# Patient Record
Sex: Female | Born: 1985 | Race: Black or African American | Hispanic: No | Marital: Single | Smoking: Current every day smoker
Health system: Southern US, Community
[De-identification: ages and names within clinical notes are randomized; demographics above are authoritative.]

## PROBLEM LIST (undated history)

## (undated) ENCOUNTER — Inpatient Hospital Stay (HOSPITAL_COMMUNITY): Payer: Self-pay

## (undated) HISTORY — PX: NO PAST SURGERIES: SHX2092

---

## 2014-11-18 ENCOUNTER — Encounter (HOSPITAL_COMMUNITY): Payer: Self-pay | Admitting: *Deleted

## 2014-11-18 ENCOUNTER — Inpatient Hospital Stay (HOSPITAL_COMMUNITY): Payer: Medicaid Other

## 2014-11-18 ENCOUNTER — Inpatient Hospital Stay (HOSPITAL_COMMUNITY)
Admission: AD | Admit: 2014-11-18 | Discharge: 2014-11-18 | Disposition: A | Discharge status: Home / Self Care | Payer: Medicaid Other | Source: Ambulatory Visit | Attending: Family Medicine | Admitting: Family Medicine

## 2014-11-18 DIAGNOSIS — Z3A01 Less than 8 weeks gestation of pregnancy: Secondary | ICD-10-CM | POA: Insufficient documentation

## 2014-11-18 DIAGNOSIS — A599 Trichomoniasis, unspecified: Secondary | ICD-10-CM

## 2014-11-18 DIAGNOSIS — O26851 Spotting complicating pregnancy, first trimester: Secondary | ICD-10-CM

## 2014-11-18 DIAGNOSIS — R109 Unspecified abdominal pain: Secondary | ICD-10-CM | POA: Diagnosis present

## 2014-11-18 DIAGNOSIS — O98311 Other infections with a predominantly sexual mode of transmission complicating pregnancy, first trimester: Secondary | ICD-10-CM | POA: Insufficient documentation

## 2014-11-18 DIAGNOSIS — F17210 Nicotine dependence, cigarettes, uncomplicated: Secondary | ICD-10-CM | POA: Insufficient documentation

## 2014-11-18 DIAGNOSIS — O3680X0 Pregnancy with inconclusive fetal viability, not applicable or unspecified: Secondary | ICD-10-CM

## 2014-11-18 DIAGNOSIS — O26899 Other specified pregnancy related conditions, unspecified trimester: Secondary | ICD-10-CM

## 2014-11-18 DIAGNOSIS — O99331 Smoking (tobacco) complicating pregnancy, first trimester: Secondary | ICD-10-CM | POA: Insufficient documentation

## 2014-11-18 DIAGNOSIS — A5901 Trichomonal vulvovaginitis: Secondary | ICD-10-CM | POA: Insufficient documentation

## 2014-11-18 LAB — URINALYSIS, ROUTINE W REFLEX MICROSCOPIC
BILIRUBIN URINE: NEGATIVE
GLUCOSE, UA: NEGATIVE mg/dL
HGB URINE DIPSTICK: NEGATIVE
KETONES UR: NEGATIVE mg/dL
NITRITE: NEGATIVE
PH: 8.5 — AB (ref 5.0–8.0)
Protein, ur: NEGATIVE mg/dL
SPECIFIC GRAVITY, URINE: 1.015 (ref 1.005–1.030)
Urobilinogen, UA: 2 mg/dL — ABNORMAL HIGH (ref 0.0–1.0)

## 2014-11-18 LAB — CBC WITH DIFFERENTIAL/PLATELET
Basophils Absolute: 0 10*3/uL (ref 0.0–0.1)
Basophils Relative: 1 %
EOS ABS: 0.2 10*3/uL (ref 0.0–0.7)
EOS PCT: 3 %
HCT: 37.2 % (ref 36.0–46.0)
HEMOGLOBIN: 12.9 g/dL (ref 12.0–15.0)
LYMPHS ABS: 2.7 10*3/uL (ref 0.7–4.0)
LYMPHS PCT: 33 %
MCH: 32.3 pg (ref 26.0–34.0)
MCHC: 34.7 g/dL (ref 30.0–36.0)
MCV: 93 fL (ref 78.0–100.0)
MONOS PCT: 5 %
Monocytes Absolute: 0.4 10*3/uL (ref 0.1–1.0)
Neutro Abs: 4.8 10*3/uL (ref 1.7–7.7)
Neutrophils Relative %: 58 %
PLATELETS: 196 10*3/uL (ref 150–400)
RBC: 4 MIL/uL (ref 3.87–5.11)
RDW: 13.4 % (ref 11.5–15.5)
WBC: 8.1 10*3/uL (ref 4.0–10.5)

## 2014-11-18 LAB — URINE MICROSCOPIC-ADD ON

## 2014-11-18 LAB — WET PREP, GENITAL: Yeast Wet Prep HPF POC: NONE SEEN

## 2014-11-18 LAB — ABO/RH: ABO/RH(D): B POS

## 2014-11-18 LAB — HCG, QUANTITATIVE, PREGNANCY: hCG, Beta Chain, Quant, S: 2073 m[IU]/mL — ABNORMAL HIGH (ref <5)

## 2014-11-18 LAB — POCT PREGNANCY, URINE: Preg Test, Ur: POSITIVE — AB

## 2014-11-18 MED ORDER — METRONIDAZOLE 500 MG PO TABS
2000.0000 mg | ORAL_TABLET | Freq: Once | ORAL | Status: AC
  Administered 2014-11-18: 2000 mg via ORAL
  Filled 2014-11-18: qty 4

## 2014-11-18 NOTE — MAU Note (Signed)
Pt presents complaining of lower abdominal pain x2 days. Had spotting but it has resolved. +HPT. LMP 10/10/2014. Denies abnormal discharge.

## 2014-11-18 NOTE — Discharge Instructions (Signed)
First Trimester of Pregnancy The first trimester of pregnancy is from week 1 until the end of week 12 (months 1 through 3). During this time, your baby will begin to develop inside you. At 6-8 weeks, the eyes and face are formed, and the heartbeat can be seen on ultrasound. At the end of 12 weeks, all the baby's organs are formed. Prenatal care is all the medical care you receive before the birth of your baby. Make sure you get good prenatal care and follow all of your doctor's instructions. HOME CARE  Medicines  Take medicine only as told by your doctor. Some medicines are safe and some are not during pregnancy.  Take your prenatal vitamins as told by your doctor.  Take medicine that helps you poop (stool softener) as needed if your doctor says it is okay. Diet  Eat regular, healthy meals.  Your doctor will tell you the amount of weight gain that is right for you.  Avoid raw meat and uncooked cheese.  If you feel sick to your stomach (nauseous) or throw up (vomit):  Eat 4 or 5 small meals a day instead of 3 large meals.  Try eating a few soda crackers.  Drink liquids between meals instead of during meals.  If you have a hard time pooping (constipation):  Eat high-fiber foods like fresh vegetables, fruit, and whole grains.  Drink enough fluids to keep your pee (urine) clear or pale yellow. Activity and Exercise  Exercise only as told by your doctor. Stop exercising if you have cramps or pain in your lower belly (abdomen) or low back.  Try to avoid standing for long periods of time. Move your legs often if you must stand in one place for a long time.  Avoid heavy lifting.  Wear low-heeled shoes. Sit and stand up straight.  You can have sex unless your doctor tells you not to. Relief of Pain or Discomfort  Wear a good support bra if your breasts are sore.  Take warm water baths (sitz baths) to soothe pain or discomfort caused by hemorrhoids. Use hemorrhoid cream if your  doctor says it is okay.  Rest with your legs raised if you have leg cramps or low back pain.  Wear support hose if you have puffy, bulging veins (varicose veins) in your legs. Raise (elevate) your feet for 15 minutes, 3-4 times a day. Limit salt in your diet. Prenatal Care  Schedule your prenatal visits by the twelfth week of pregnancy.  Write down your questions. Take them to your prenatal visits.  Keep all your prenatal visits as told by your doctor. Safety  Wear your seat belt at all times when driving.  Make a list of emergency phone numbers. The list should include numbers for family, friends, the hospital, and police and fire departments. General Tips  Ask your doctor for a referral to a local prenatal class. Begin classes no later than at the start of month 6 of your pregnancy.  Ask for help if you need counseling or help with nutrition. Your doctor can give you advice or tell you where to go for help.  Do not use hot tubs, steam rooms, or saunas.  Do not douche or use tampons or scented sanitary pads.  Do not cross your legs for long periods of time.  Avoid litter boxes and soil used by cats.  Avoid all smoking, herbs, and alcohol. Avoid drugs not approved by your doctor.  Visit your dentist. At home, brush your teeth  with a soft toothbrush. Be gentle when you floss. GET HELP IF:  You are dizzy.  You have mild cramps or pressure in your lower belly.  You have a nagging pain in your belly area.  You continue to feel sick to your stomach, throw up, or have watery poop (diarrhea).  You have a bad smelling fluid coming from your vagina.  You have pain with peeing (urination).  You have increased puffiness (swelling) in your face, hands, legs, or ankles. GET HELP RIGHT AWAY IF:   You have a fever.  You are leaking fluid from your vagina.  You have spotting or bleeding from your vagina.  You have very bad belly cramping or pain.  You gain or lose weight  rapidly.  You throw up blood. It may look like coffee grounds.  You are around people who have [place] measles, fifth disease, or chickenpox.  You have a very bad headache.  You have shortness of breath.  You have any kind of trauma, such as from a fall or a car accident. Document Released: 08/04/2007 Document Revised: 07/02/2013 Document Reviewed: 12/26/2012 [facility] Patient Information 2015 [place], [place]. This information is not intended to replace advice given to you by your health care provider. Make sure you discuss any questions you have with your health care provider. Trichomoniasis Trichomoniasis is an infection caused by an organism called Trichomonas. The infection can affect both women and men. In women, the outer female genitalia and the vagina are affected. In men, the penis is mainly affected, but the prostate and other reproductive organs can also be involved. Trichomoniasis is a sexually transmitted infection (STI) and is most often passed to another person through sexual contact.  RISK FACTORS  Having unprotected sexual intercourse.  Having sexual intercourse with an infected partner. SIGNS AND SYMPTOMS  Symptoms of trichomoniasis in women include:  Abnormal gray-green frothy vaginal discharge.  Itching and irritation of the vagina.  Itching and irritation of the area outside the vagina. Symptoms of trichomoniasis in men include:   Penile discharge with or without pain.  Pain during urination. This results from inflammation of the urethra. DIAGNOSIS  Trichomoniasis may be found during a Pap test or physical exam. Your health care provider may use one of the following methods to help diagnose this infection:  Examining vaginal discharge under a microscope. For men, urethral discharge would be examined.  Testing the pH of the vagina with a test tape.  Using a vaginal swab test that checks for the Trichomonas organism. A test is available that provides results  within a few minutes.  Doing a culture test for the organism. This is not usually needed. TREATMENT   You may be given medicine to fight the infection. Women should inform their health care provider if they could be or are pregnant. Some medicines used to treat the infection should not be taken during pregnancy.  Your health care provider may recommend over-the-counter medicines or creams to decrease itching or irritation.  Your sexual partner will need to be treated if infected. HOME CARE INSTRUCTIONS   Take medicines only as directed by your health care provider.  Take over-the-counter medicine for itching or irritation as directed by your health care provider.  Do not have sexual intercourse while you have the infection.  Women should not douche or wear tampons while they have the infection.  Discuss your infection with your partner. Your partner may have gotten the infection from you, or you may have gotten it from  your partner.  Have your sex partner get examined and treated if necessary.  Practice safe, informed, and protected sex.  See your health care provider for other STI testing. SEEK MEDICAL CARE IF:   You still have symptoms after you finish your medicine.  You develop abdominal pain.  You have pain when you urinate.  You have bleeding after sexual intercourse.  You develop a rash.  Your medicine makes you sick or makes you throw up (vomit). MAKE SURE YOU:  Understand these instructions.  Will watch your condition.  Will get help right away if you are not doing well or get worse. Document Released: 08/11/2000 Document Revised: 07/02/2013 Document Reviewed: 11/27/2012 [facility] Patient Information 2015 [place], [place]. This information is not intended to replace advice given to you by your health care provider. Make sure you discuss any questions you have with your health care provider.

## 2014-11-18 NOTE — MAU Provider Note (Signed)
History     CSN: [id number]  Arrival date and time: 11/18/14 4132   First Ira Busbin Initiated Contact with Patient 11/18/14 1941      Chief Complaint  Patient presents with  . Abdominal Pain   HPI [name] is a 29 y.o. [id number] at [redacted]w[redacted]d who presents to MAU today with complaint of abdominal pain in the lower abdomen x 3 days. She rates pain at 7/10 now. She has not taken anything for pain. She states that pain is constant, but worse with ambulation. She states spotting noted 3 days ago, but none today. She denies discharge, fever, N/V/D or constipation or UTI symptoms. She states last intercourse was yesterday.   OB History    Gravida Para Term Preterm AB TAB SAB Ectopic Multiple Living   History reviewed. No pertinent past medical history.  Past Surgical History  Procedure Laterality Date  . No past surgeries      History reviewed. No pertinent family history.  Social History  Substance Use Topics  . Smoking status: Current Every Day Smoker -- 0.50 packs/day for 1 years    Types: Cigarettes  . Smokeless tobacco: None  . Alcohol Use: No    Allergies: No Known Allergies  Prescriptions prior to admission  Medication Sig Dispense Refill Last Dose  . Prenatal Vit-Fe Fumarate-FA (PRENATAL MULTIVITAMIN) TABS tablet Take 1 tablet by mouth daily at 12 noon.   11/18/2014 at 0630    Review of Systems  Constitutional: Negative for fever and malaise/fatigue.  Gastrointestinal: Positive for abdominal pain. Negative for nausea, vomiting, diarrhea and constipation.  Genitourinary: Negative for dysuria, urgency and frequency.       Neg - vaginal bleeding, discharge   Physical Exam   Blood pressure 115/71, pulse 85, temperature 98 F (36.7 C), temperature source Oral, resp. rate 18, height  (1.651 m), weight 199 lb 6.4 oz (90.447 kg), last menstrual period 10/10/2014.  Physical Exam  Nursing note and vitals reviewed. Constitutional: She is  oriented to person, place, and time. She appears well-developed and well-nourished. No distress.  HENT:  Head: Normocephalic and atraumatic.  Cardiovascular: Normal rate.   Respiratory: Effort normal.  GI: Soft. She exhibits no distension and no mass. There is tenderness (mild tenderness to palpation of the LLQ ). There is no rebound and no guarding.  Genitourinary: Uterus is not enlarged and not tender. Cervix exhibits no motion tenderness, no discharge and no friability. Right adnexum displays no mass and no tenderness. Left adnexum displays no mass and no tenderness. No bleeding in the vagina. Vaginal discharge (small amount of thin, white discharge noted) found.  Cervix: closed, thick  Neurological: She is alert and oriented to person, place, and time.  Skin: Skin is warm and dry. No erythema.  Psychiatric: She has a normal mood and affect.    Results for orders placed or performed during the hospital encounter of 11/18/14 (from the past 24 hour(s))  Urinalysis, Routine w reflex microscopic (not at [facility])     Status: Abnormal   Collection Time: 11/18/14  7:05 PM  Result Value Ref Range   Color, Urine YELLOW YELLOW   APPearance HAZY (A) CLEAR   Specific Gravity, Urine 1.015 1.005 - 1.030   pH 8.5 (H) 5.0 - 8.0   Glucose, UA NEGATIVE NEGATIVE mg/dL   Hgb urine dipstick NEGATIVE NEGATIVE   Bilirubin Urine NEGATIVE NEGATIVE   Ketones, ur NEGATIVE  NEGATIVE mg/dL   Protein, ur NEGATIVE NEGATIVE mg/dL   Urobilinogen, UA 2.0 (H) 0.0 - 1.0 mg/dL   Nitrite NEGATIVE NEGATIVE   Leukocytes, UA MODERATE (A) NEGATIVE  Urine microscopic-add on     Status: Abnormal   Collection Time: 11/18/14  7:05 PM  Result Value Ref Range   Squamous Epithelial / LPF MANY (A) RARE   WBC, UA 11-20 <3 WBC/hpf   RBC / HPF 3-6 <3 RBC/hpf   Bacteria, UA MANY (A) RARE   Urine-Other MUCOUS PRESENT   Pregnancy, urine POC     Status: Abnormal   Collection Time: 11/18/14  7:33 PM  Result Value Ref Range   Preg  Test, Ur POSITIVE (A) NEGATIVE  CBC with Differential/Platelet     Status: None   Collection Time: 11/18/14  7:50 PM  Result Value Ref Range   WBC 8.1 4.0 - 10.5 K/uL   RBC 4.00 3.87 - 5.11 MIL/uL   Hemoglobin 12.9 12.0 - 15.0 g/dL   HCT 16.1 09.6 - 04.5 %   MCV 93.0 78.0 - 100.0 fL   MCH 32.3 26.0 - 34.0 pg   MCHC 34.7 30.0 - 36.0 g/dL   RDW 40.9 81.1 - 91.4 %   Platelets 196 150 - 400 K/uL   Neutrophils Relative % 58 %   Neutro Abs 4.8 1.7 - 7.7 K/uL   Lymphocytes Relative 33 %   Lymphs Abs 2.7 0.7 - 4.0 K/uL   Monocytes Relative 5 %   Monocytes Absolute 0.4 0.1 - 1.0 K/uL   Eosinophils Relative 3 %   Eosinophils Absolute 0.2 0.0 - 0.7 K/uL   Basophils Relative 1 %   Basophils Absolute 0.0 0.0 - 0.1 K/uL  ABO/Rh     Status: None (Preliminary result)   Collection Time: 11/18/14  7:50 PM  Result Value Ref Range   ABO/RH(D) B POS   hCG, quantitative, pregnancy     Status: Abnormal   Collection Time: 11/18/14  7:50 PM  Result Value Ref Range   hCG, Beta Chain, Quant, S 2073 (H) <5 mIU/mL  Wet prep, genital     Status: Abnormal   Collection Time: 11/18/14  7:55 PM  Result Value Ref Range   Yeast Wet Prep HPF POC NONE SEEN NONE SEEN   Trich, Wet Prep FEW (A) NONE SEEN   Clue Cells Wet Prep HPF POC FEW (A) NONE SEEN   WBC, Wet Prep HPF POC FEW (A) NONE SEEN   US Ob Comp Less 14 Wks  11/18/2014   CLINICAL DATA:  Abdominal pain affecting pregnancy, spotting in first trimester  EXAM: OBSTETRIC <14 WK [place] AND TRANSVAGINAL OB US  TECHNIQUE: Both transabdominal and transvaginal ultrasound examinations were performed for complete evaluation of the gestation as well as the maternal uterus, adnexal regions, and pelvic cul-de-sac. Transvaginal technique was performed to assess early pregnancy.  COMPARISON:  None.  FINDINGS: Intrauterine gestational sac: Present  Yolk sac:  Not identified  Embryo:  Not identified  Cardiac Activity: N/A  Heart Rate: N/A  bpm  MSD: 3.9  mm   5 w   1  d  Maternal  uterus/adnexae:  Probable tiny subchorionic hemorrhage.  RIGHT ovary normal size and morphology, 3.9 x 2.2 x 4.5 cm.  LEFT ovary normal size and morphology, 4.1 x 1.3 x 4.3 cm.  Small LEFT adnexal cyst of simple character, 1.8 x 1.0 x 1.4 cm.  No additional adnexal masses.  Small amount of nonspecific free pelvic fluid.  IMPRESSION:  Tiny probable gestational sac within the uterus as above with probable tiny amount of subchorionic hemorrhage.  Small amount of nonspecific free pelvic fluid.  Small LEFT paraovarian cyst.   Electronically Signed   By: Ulyses Southward M.D.   On: 11/18/2014 21:21   US Ob Transvaginal  11/18/2014   CLINICAL DATA:  Abdominal pain affecting pregnancy, spotting in first trimester  EXAM: OBSTETRIC <14 WK [place] AND TRANSVAGINAL OB US  TECHNIQUE: Both transabdominal and transvaginal ultrasound examinations were performed for complete evaluation of the gestation as well as the maternal uterus, adnexal regions, and pelvic cul-de-sac. Transvaginal technique was performed to assess early pregnancy.  COMPARISON:  None.  FINDINGS: Intrauterine gestational sac: Present  Yolk sac:  Not identified  Embryo:  Not identified  Cardiac Activity: N/A  Heart Rate: N/A  bpm  MSD: 3.9  mm   5 w   1  d  Maternal uterus/adnexae:  Probable tiny subchorionic hemorrhage.  RIGHT ovary normal size and morphology, 3.9 x 2.2 x 4.5 cm.  LEFT ovary normal size and morphology, 4.1 x 1.3 x 4.3 cm.  Small LEFT adnexal cyst of simple character, 1.8 x 1.0 x 1.4 cm.  No additional adnexal masses.  Small amount of nonspecific free pelvic fluid.  IMPRESSION: Tiny probable gestational sac within the uterus as above with probable tiny amount of subchorionic hemorrhage.  Small amount of nonspecific free pelvic fluid.  Small LEFT paraovarian cyst.   Electronically Signed   By: Ulyses Southward M.D.   On: 11/18/2014 21:21    MAU Course  Procedures None  MDM +UPT UA, wet prep, GC/chlamydia, CBC, ABO/Rh, quant hCG, HIV, RPR and [place] today  to rule out ectopic pregnancy Urine culture pending  + Trichomonas on wet prep. Patient treated with 2 G Flagyl in MAU.  Assessment and Plan  A: Pregnancy of unknown location Abdominal pain in pregnancy Trichomonas infection  P: Discharge home Bleeding/ectopic precautions discussed Patient treated in MAU for trichomonas. Partner treatment and abstinence advised Patient advised to follow-up in MAU in 48 hours for repeat labs or sooner if her condition were to change or worsen   Marny Lowenstein, PA-C  11/18/2014, 9:24 PM

## 2014-11-19 ENCOUNTER — Telehealth (HOSPITAL_COMMUNITY): Payer: Self-pay | Admitting: *Deleted

## 2014-11-19 DIAGNOSIS — A749 Chlamydial infection, unspecified: Secondary | ICD-10-CM

## 2014-11-19 DIAGNOSIS — O98811 Other maternal infectious and parasitic diseases complicating pregnancy, first trimester: Principal | ICD-10-CM

## 2014-11-19 LAB — GC/CHLAMYDIA PROBE AMP (~~LOC~~) NOT AT ARMC
Chlamydia: POSITIVE — AB
Neisseria Gonorrhea: NEGATIVE

## 2014-11-19 LAB — HIV ANTIBODY (ROUTINE TESTING W REFLEX): HIV Screen 4th Generation wRfx: NONREACTIVE

## 2014-11-19 LAB — RPR: RPR: NONREACTIVE

## 2014-11-19 MED ORDER — AZITHROMYCIN 500 MG PO TABS: ORAL_TABLET | ORAL | Status: AC

## 2014-11-19 NOTE — Telephone Encounter (Signed)
Telephone call to patient regarding positive chlamydia culture, patient notified.  Patient had not been treated, Rx routed to pharmacy per protocol.  Instructed patient to notify her partner for treatment and to abstain from sex for seven days post treatment.  Report faxed to [facility].

## 2014-11-21 LAB — CULTURE, OB URINE

## 2015-09-23 ENCOUNTER — Encounter (HOSPITAL_COMMUNITY): Payer: Self-pay

## 2017-04-15 IMAGING — US US OB TRANSVAGINAL
1 series · 15 of 28 positions shown · non-contrast
Comparison: None.

CLINICAL DATA: Abdominal pain affecting pregnancy, spotting in
first trimester

EXAM:
OBSTETRIC <14 WK US AND TRANSVAGINAL OB US
TECHNIQUE: Both transabdominal and transvaginal ultrasound examinations were
performed for complete evaluation of the gestation as well as the
maternal uterus, adnexal regions, and pelvic cul-de-sac.
Transvaginal technique was performed to assess early pregnancy.

[Series 1: us ob transvaginal · 15 of 85 slices shown]
[im 1/85]
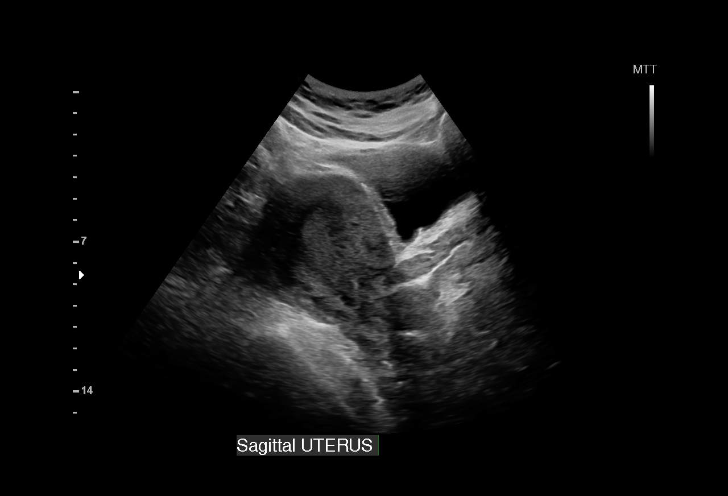
[im 7/85]
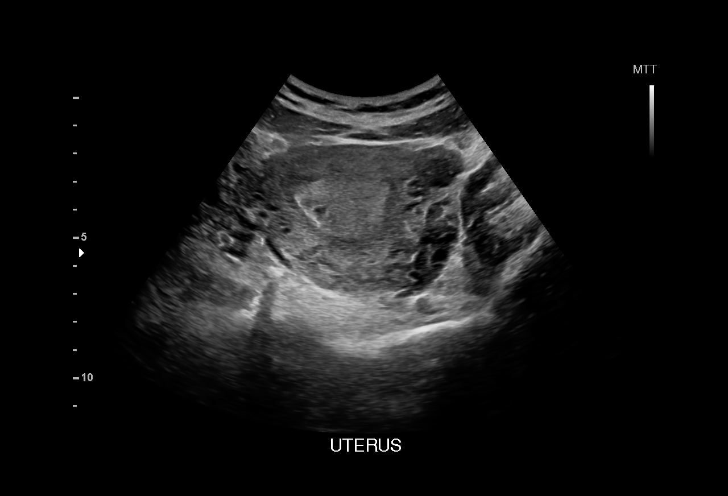
[im 13/85]
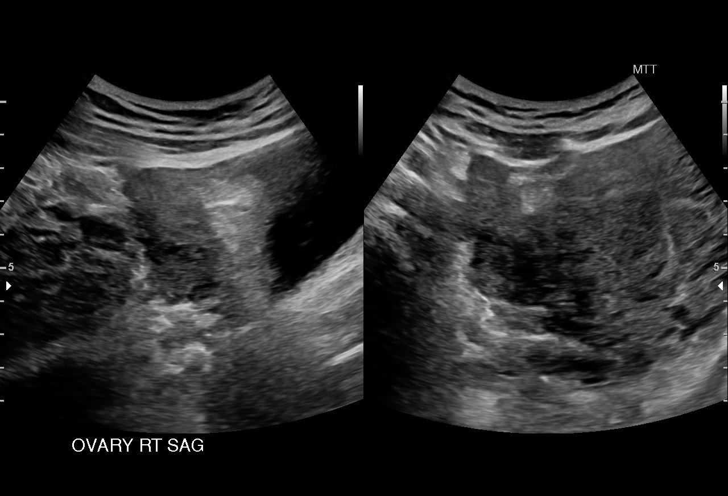
[im 19/85]
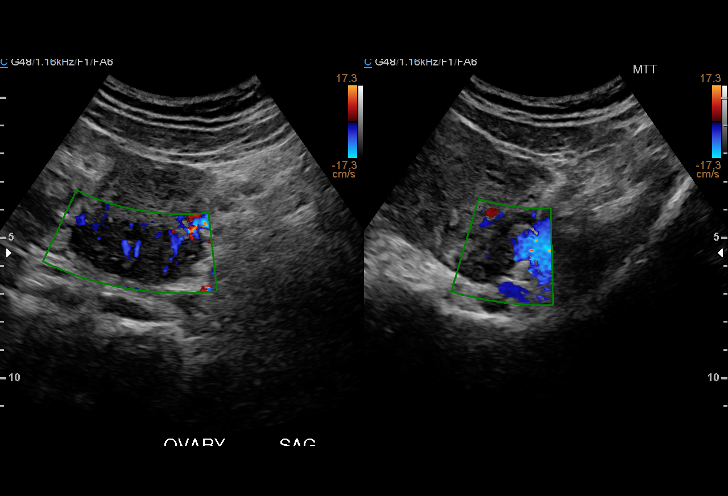
[im 25/85]
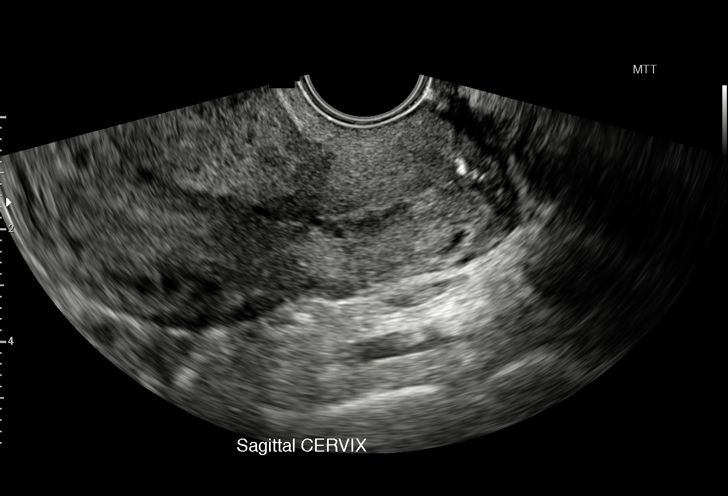
[im 32/85]
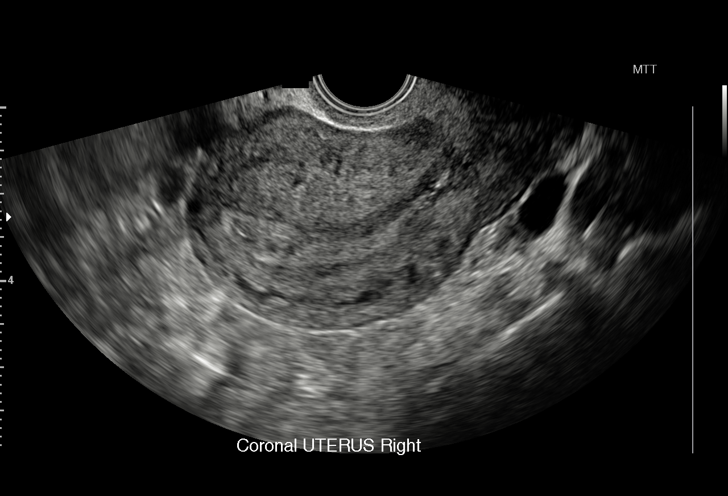
[im 38/85]
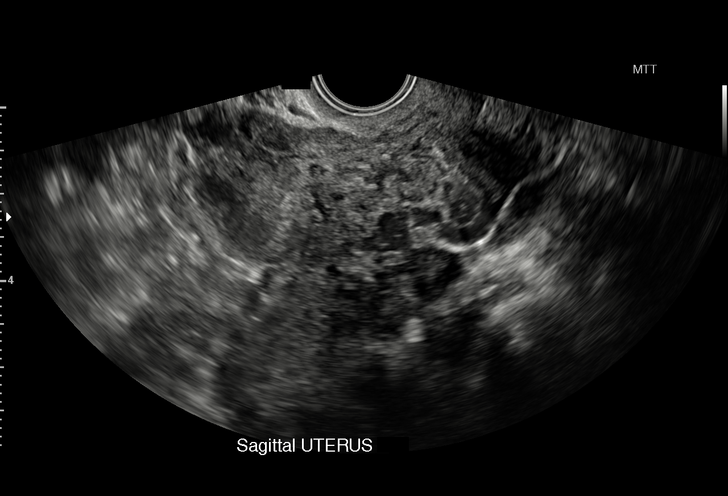
[im 44/85]
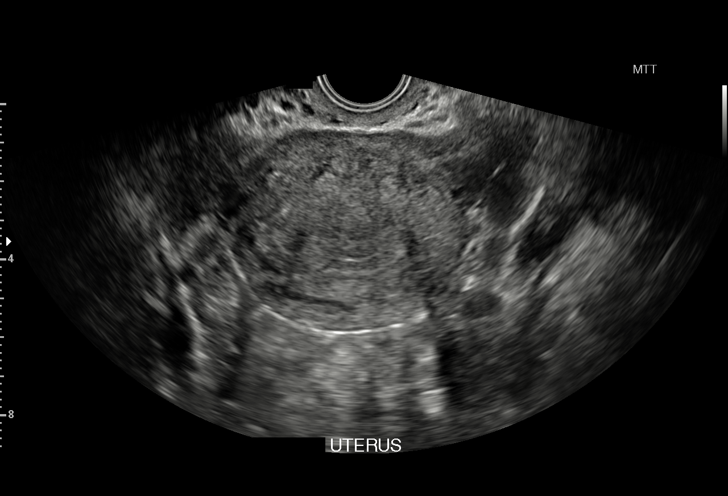
[im 47/85]
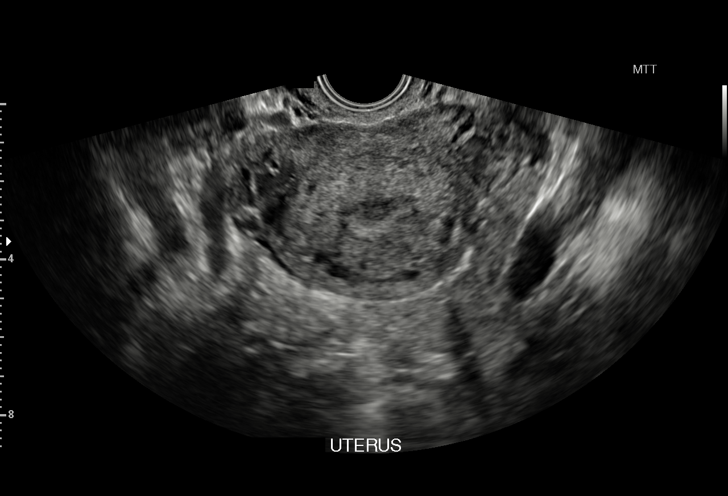
[im 53/85]
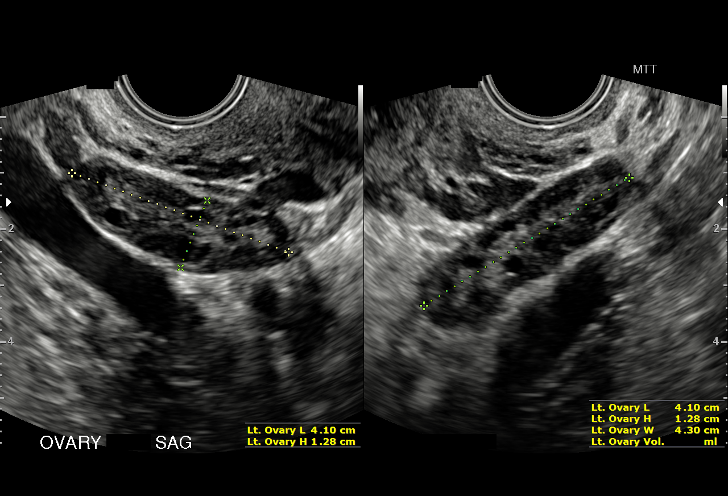
[im 60/85]
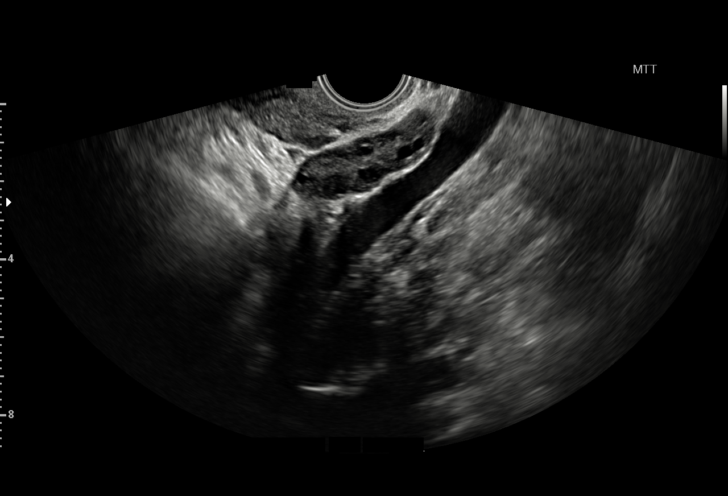
[im 66/85]
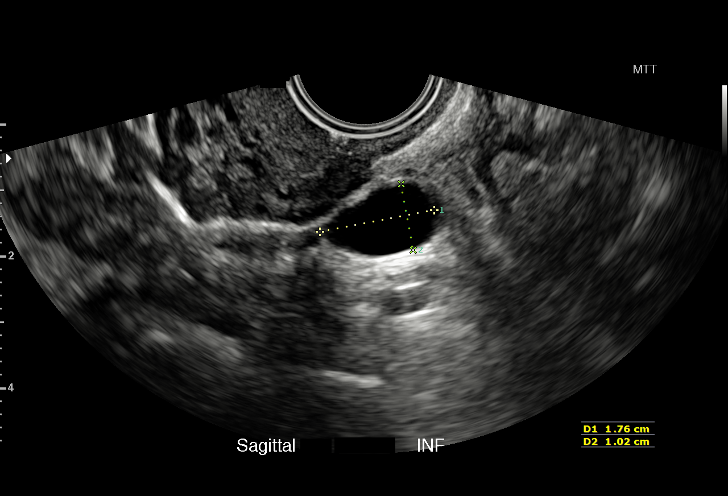
[im 72/85]
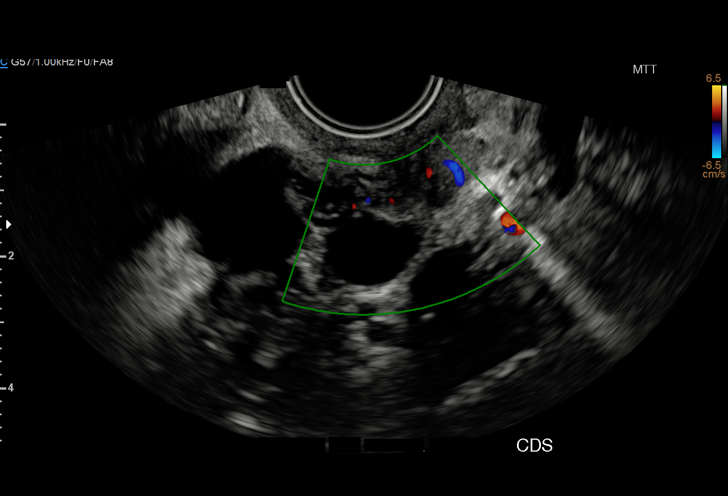
[im 78/85]
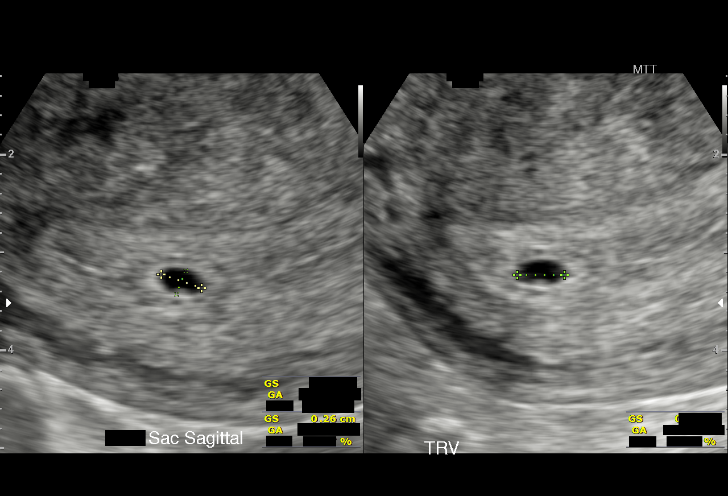
[im 85/85]
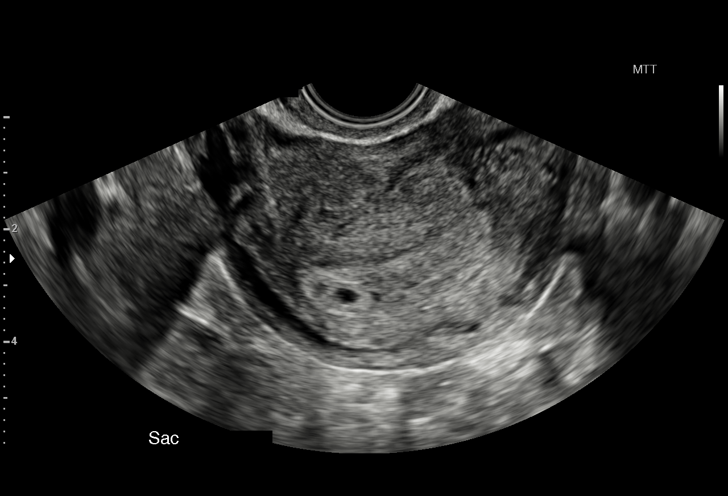

[15 of 28 positions shown; findings below may reference images not displayed]

FINDINGS: Intrauterine gestational sac: Present

Yolk sac:  Not identified

Embryo:  Not identified

Cardiac Activity: N/A

Heart Rate: N/A  bpm

MSD: 3.9  mm   5 w   1  d

Maternal uterus/adnexae:

Probable tiny subchorionic hemorrhage.

RIGHT ovary normal size and morphology, 3.9 x 2.2 x 4.5 cm.

LEFT ovary normal size and morphology, 4.1 x 1.3 x 4.3 cm.

Small LEFT adnexal cyst of simple character, 1.8 x 1.0 x 1.4 cm.

No additional adnexal masses.

Small amount of nonspecific free pelvic fluid.
IMPRESSION: Tiny probable gestational sac within the uterus as above with
probable tiny amount of subchorionic hemorrhage.

Small amount of nonspecific free pelvic fluid.

Small LEFT paraovarian cyst.
